# Patient Record
Sex: Female | Born: 1935 | Race: White | Hispanic: No
Health system: Southern US, Community
[De-identification: ages and names within clinical notes are randomized; demographics above are authoritative.]

---

## 2004-06-18 ENCOUNTER — Ambulatory Visit: Payer: Self-pay | Admitting: Internal Medicine

## 2004-07-19 ENCOUNTER — Ambulatory Visit: Payer: Self-pay | Admitting: Internal Medicine

## 2004-08-25 ENCOUNTER — Ambulatory Visit: Payer: Self-pay | Admitting: Internal Medicine

## 2004-09-18 ENCOUNTER — Ambulatory Visit: Payer: Self-pay | Admitting: Internal Medicine

## 2004-10-19 ENCOUNTER — Ambulatory Visit: Payer: Self-pay | Admitting: Internal Medicine

## 2004-12-01 ENCOUNTER — Ambulatory Visit: Payer: Self-pay | Admitting: Internal Medicine

## 2004-12-17 ENCOUNTER — Ambulatory Visit: Payer: Self-pay | Admitting: Internal Medicine

## 2005-01-16 ENCOUNTER — Ambulatory Visit: Payer: Self-pay | Admitting: Internal Medicine

## 2005-02-23 ENCOUNTER — Ambulatory Visit: Payer: Self-pay | Admitting: Internal Medicine

## 2005-03-18 ENCOUNTER — Ambulatory Visit: Payer: Self-pay | Admitting: Internal Medicine

## 2005-04-18 ENCOUNTER — Ambulatory Visit: Payer: Self-pay | Admitting: Internal Medicine

## 2005-06-05 ENCOUNTER — Ambulatory Visit: Payer: Self-pay | Admitting: Internal Medicine

## 2005-06-07 ENCOUNTER — Ambulatory Visit: Payer: Self-pay | Admitting: Internal Medicine

## 2005-06-18 ENCOUNTER — Ambulatory Visit: Payer: Self-pay | Admitting: Internal Medicine

## 2005-07-19 ENCOUNTER — Ambulatory Visit: Payer: Self-pay | Admitting: Internal Medicine

## 2005-08-18 ENCOUNTER — Ambulatory Visit: Payer: Self-pay | Admitting: Internal Medicine

## 2005-09-18 ENCOUNTER — Ambulatory Visit: Payer: Self-pay | Admitting: Internal Medicine

## 2005-10-19 ENCOUNTER — Ambulatory Visit: Payer: Self-pay | Admitting: Internal Medicine

## 2005-11-16 ENCOUNTER — Ambulatory Visit: Payer: Self-pay | Admitting: Internal Medicine

## 2005-12-17 ENCOUNTER — Ambulatory Visit: Payer: Self-pay | Admitting: Internal Medicine

## 2006-01-16 ENCOUNTER — Ambulatory Visit: Payer: Self-pay | Admitting: Internal Medicine

## 2006-02-16 ENCOUNTER — Ambulatory Visit: Payer: Self-pay | Admitting: Internal Medicine

## 2006-03-18 ENCOUNTER — Ambulatory Visit: Payer: Self-pay | Admitting: Internal Medicine

## 2006-04-18 ENCOUNTER — Ambulatory Visit: Payer: Self-pay | Admitting: Internal Medicine

## 2006-05-28 ENCOUNTER — Ambulatory Visit: Payer: Self-pay | Admitting: Internal Medicine

## 2006-06-15 ENCOUNTER — Ambulatory Visit: Payer: Self-pay | Admitting: Internal Medicine

## 2006-06-18 ENCOUNTER — Ambulatory Visit: Payer: Self-pay | Admitting: Internal Medicine

## 2006-07-19 ENCOUNTER — Ambulatory Visit: Payer: Self-pay | Admitting: Internal Medicine

## 2006-08-18 ENCOUNTER — Ambulatory Visit: Payer: Self-pay | Admitting: Internal Medicine

## 2006-09-06 ENCOUNTER — Ambulatory Visit: Payer: Self-pay | Admitting: Surgery

## 2006-09-18 ENCOUNTER — Ambulatory Visit: Payer: Self-pay | Admitting: Internal Medicine

## 2006-10-19 ENCOUNTER — Ambulatory Visit: Payer: Self-pay | Admitting: Internal Medicine

## 2006-11-17 ENCOUNTER — Ambulatory Visit: Payer: Self-pay | Admitting: Internal Medicine

## 2006-12-18 ENCOUNTER — Ambulatory Visit: Payer: Self-pay | Admitting: Internal Medicine

## 2007-01-17 ENCOUNTER — Ambulatory Visit: Payer: Self-pay | Admitting: Internal Medicine

## 2007-02-17 ENCOUNTER — Ambulatory Visit: Payer: Self-pay | Admitting: Internal Medicine

## 2007-03-11 ENCOUNTER — Ambulatory Visit: Payer: Self-pay | Admitting: Internal Medicine

## 2007-03-15 ENCOUNTER — Other Ambulatory Visit: Payer: Self-pay

## 2007-03-15 ENCOUNTER — Inpatient Hospital Stay: Payer: Self-pay | Admitting: Oncology

## 2007-03-19 ENCOUNTER — Ambulatory Visit: Payer: Self-pay | Admitting: Internal Medicine

## 2007-04-19 ENCOUNTER — Ambulatory Visit: Payer: Self-pay | Admitting: Internal Medicine

## 2007-05-20 ENCOUNTER — Ambulatory Visit: Payer: Self-pay | Admitting: Internal Medicine

## 2007-06-19 ENCOUNTER — Ambulatory Visit: Payer: Self-pay | Admitting: Internal Medicine

## 2007-07-15 ENCOUNTER — Ambulatory Visit: Payer: Self-pay | Admitting: Internal Medicine

## 2007-07-20 ENCOUNTER — Ambulatory Visit: Payer: Self-pay | Admitting: Internal Medicine

## 2007-08-19 ENCOUNTER — Ambulatory Visit: Payer: Self-pay | Admitting: Internal Medicine

## 2007-08-20 ENCOUNTER — Encounter: Payer: Self-pay | Admitting: Internal Medicine

## 2007-08-20 ENCOUNTER — Ambulatory Visit: Payer: Self-pay | Admitting: Internal Medicine

## 2007-08-26 ENCOUNTER — Inpatient Hospital Stay: Payer: Self-pay | Admitting: Internal Medicine

## 2007-08-26 ENCOUNTER — Other Ambulatory Visit: Payer: Self-pay

## 2007-09-19 ENCOUNTER — Ambulatory Visit: Payer: Self-pay | Admitting: Internal Medicine

## 2007-09-19 ENCOUNTER — Encounter: Payer: Self-pay | Admitting: Internal Medicine

## 2007-10-20 ENCOUNTER — Ambulatory Visit: Payer: Self-pay | Admitting: Internal Medicine

## 2007-11-07 ENCOUNTER — Encounter: Payer: Self-pay | Admitting: Internal Medicine

## 2007-11-17 ENCOUNTER — Ambulatory Visit: Payer: Self-pay | Admitting: Internal Medicine

## 2007-12-18 ENCOUNTER — Ambulatory Visit: Payer: Self-pay | Admitting: Internal Medicine

## 2008-01-17 ENCOUNTER — Ambulatory Visit: Payer: Self-pay | Admitting: Internal Medicine

## 2008-02-17 ENCOUNTER — Ambulatory Visit: Payer: Self-pay | Admitting: Internal Medicine

## 2008-03-18 ENCOUNTER — Ambulatory Visit: Payer: Self-pay | Admitting: Internal Medicine

## 2008-04-18 ENCOUNTER — Ambulatory Visit: Payer: Self-pay | Admitting: Internal Medicine

## 2008-05-04 ENCOUNTER — Ambulatory Visit: Payer: Self-pay | Admitting: Internal Medicine

## 2008-05-19 ENCOUNTER — Ambulatory Visit: Payer: Self-pay | Admitting: Internal Medicine

## 2008-06-11 ENCOUNTER — Ambulatory Visit: Payer: Self-pay | Admitting: Internal Medicine

## 2008-06-18 ENCOUNTER — Ambulatory Visit: Payer: Self-pay | Admitting: Internal Medicine

## 2008-07-19 ENCOUNTER — Ambulatory Visit: Payer: Self-pay | Admitting: Internal Medicine

## 2008-08-18 ENCOUNTER — Ambulatory Visit: Payer: Self-pay | Admitting: Internal Medicine

## 2008-09-18 ENCOUNTER — Ambulatory Visit: Payer: Self-pay | Admitting: Internal Medicine

## 2008-10-19 ENCOUNTER — Ambulatory Visit: Payer: Self-pay | Admitting: Internal Medicine

## 2008-11-16 ENCOUNTER — Ambulatory Visit: Payer: Self-pay | Admitting: Internal Medicine

## 2009-01-01 ENCOUNTER — Ambulatory Visit: Payer: Self-pay | Admitting: Internal Medicine

## 2009-01-04 ENCOUNTER — Ambulatory Visit: Payer: Self-pay | Admitting: Internal Medicine

## 2009-01-16 ENCOUNTER — Ambulatory Visit: Payer: Self-pay | Admitting: Internal Medicine

## 2009-03-01 ENCOUNTER — Ambulatory Visit: Payer: Self-pay | Admitting: Internal Medicine

## 2009-03-12 ENCOUNTER — Ambulatory Visit: Payer: Self-pay | Admitting: General Practice

## 2009-03-18 ENCOUNTER — Ambulatory Visit: Payer: Self-pay | Admitting: Internal Medicine

## 2009-03-29 ENCOUNTER — Ambulatory Visit: Payer: Self-pay | Admitting: Internal Medicine

## 2009-04-08 ENCOUNTER — Ambulatory Visit: Payer: Self-pay | Admitting: General Practice

## 2009-04-18 ENCOUNTER — Ambulatory Visit: Payer: Self-pay | Admitting: Internal Medicine

## 2009-04-19 ENCOUNTER — Inpatient Hospital Stay: Payer: Self-pay | Admitting: General Practice

## 2009-04-24 ENCOUNTER — Encounter: Payer: Self-pay | Admitting: Internal Medicine

## 2009-04-24 ENCOUNTER — Ambulatory Visit: Payer: Self-pay | Admitting: Internal Medicine

## 2009-06-01 ENCOUNTER — Ambulatory Visit: Payer: Self-pay | Admitting: Internal Medicine

## 2009-06-11 ENCOUNTER — Encounter: Payer: Self-pay | Admitting: General Practice

## 2009-06-18 ENCOUNTER — Ambulatory Visit: Payer: Self-pay | Admitting: Internal Medicine

## 2009-06-18 ENCOUNTER — Encounter: Payer: Self-pay | Admitting: General Practice

## 2009-06-28 ENCOUNTER — Ambulatory Visit: Payer: Self-pay | Admitting: Internal Medicine

## 2009-07-19 ENCOUNTER — Ambulatory Visit: Payer: Self-pay | Admitting: Internal Medicine

## 2009-07-19 ENCOUNTER — Encounter: Payer: Self-pay | Admitting: General Practice

## 2009-08-18 ENCOUNTER — Ambulatory Visit: Payer: Self-pay | Admitting: Internal Medicine

## 2009-09-18 ENCOUNTER — Ambulatory Visit: Payer: Self-pay | Admitting: Internal Medicine

## 2009-10-19 ENCOUNTER — Ambulatory Visit: Payer: Self-pay | Admitting: Internal Medicine

## 2009-11-16 ENCOUNTER — Ambulatory Visit: Payer: Self-pay | Admitting: Internal Medicine

## 2009-12-17 ENCOUNTER — Ambulatory Visit: Payer: Self-pay | Admitting: Internal Medicine

## 2010-01-16 ENCOUNTER — Ambulatory Visit: Payer: Self-pay | Admitting: Internal Medicine

## 2010-02-16 ENCOUNTER — Ambulatory Visit: Payer: Self-pay | Admitting: Internal Medicine

## 2010-02-17 ENCOUNTER — Ambulatory Visit: Payer: Self-pay | Admitting: Internal Medicine

## 2010-03-18 ENCOUNTER — Ambulatory Visit: Payer: Self-pay | Admitting: Internal Medicine

## 2010-04-18 ENCOUNTER — Ambulatory Visit: Payer: Self-pay | Admitting: Internal Medicine

## 2010-05-10 ENCOUNTER — Ambulatory Visit: Payer: Self-pay | Admitting: Internal Medicine

## 2010-05-19 ENCOUNTER — Ambulatory Visit: Payer: Self-pay | Admitting: Internal Medicine

## 2010-06-18 ENCOUNTER — Ambulatory Visit: Payer: Self-pay | Admitting: Internal Medicine

## 2010-07-19 ENCOUNTER — Ambulatory Visit: Payer: Self-pay | Admitting: Internal Medicine

## 2010-08-18 ENCOUNTER — Ambulatory Visit: Payer: Self-pay | Admitting: Internal Medicine

## 2010-09-18 ENCOUNTER — Ambulatory Visit: Payer: Self-pay | Admitting: Internal Medicine

## 2010-10-19 ENCOUNTER — Ambulatory Visit: Payer: Self-pay | Admitting: Internal Medicine

## 2010-11-17 ENCOUNTER — Ambulatory Visit: Payer: Self-pay | Admitting: Internal Medicine

## 2010-12-18 ENCOUNTER — Ambulatory Visit: Payer: Self-pay | Admitting: Internal Medicine

## 2010-12-22 ENCOUNTER — Ambulatory Visit: Payer: Self-pay

## 2010-12-28 ENCOUNTER — Ambulatory Visit: Payer: Self-pay | Admitting: Internal Medicine

## 2010-12-29 ENCOUNTER — Ambulatory Visit: Payer: Self-pay

## 2011-01-17 ENCOUNTER — Ambulatory Visit: Payer: Self-pay | Admitting: Internal Medicine

## 2011-02-14 ENCOUNTER — Ambulatory Visit: Payer: Self-pay | Admitting: Unknown Physician Specialty

## 2011-02-17 ENCOUNTER — Ambulatory Visit: Payer: Self-pay | Admitting: Internal Medicine

## 2011-02-22 ENCOUNTER — Ambulatory Visit: Payer: Self-pay | Admitting: Unknown Physician Specialty

## 2011-02-22 ENCOUNTER — Emergency Department: Payer: Self-pay | Admitting: Emergency Medicine

## 2011-02-24 LAB — PATHOLOGY REPORT

## 2011-03-23 ENCOUNTER — Ambulatory Visit: Payer: Self-pay | Admitting: Internal Medicine

## 2011-04-19 ENCOUNTER — Ambulatory Visit: Payer: Self-pay | Admitting: Internal Medicine

## 2011-04-20 ENCOUNTER — Ambulatory Visit: Payer: Self-pay | Admitting: Internal Medicine

## 2011-05-15 ENCOUNTER — Inpatient Hospital Stay: Payer: Self-pay | Admitting: Internal Medicine

## 2011-05-20 ENCOUNTER — Ambulatory Visit: Payer: Self-pay | Admitting: Internal Medicine

## 2011-06-19 ENCOUNTER — Ambulatory Visit: Payer: Self-pay | Admitting: Internal Medicine

## 2011-07-20 ENCOUNTER — Ambulatory Visit: Payer: Self-pay | Admitting: Internal Medicine

## 2011-08-30 ENCOUNTER — Ambulatory Visit: Payer: Self-pay | Admitting: Internal Medicine

## 2011-09-19 ENCOUNTER — Ambulatory Visit: Payer: Self-pay | Admitting: Internal Medicine

## 2011-09-27 LAB — CANCER CENTER HEMOGLOBIN: HGB: 12.7 g/dL (ref 12.0–16.0)

## 2011-10-20 ENCOUNTER — Ambulatory Visit: Payer: Self-pay | Admitting: Internal Medicine

## 2011-10-25 LAB — FERRITIN: Ferritin (ARMC): 26 ng/mL (ref 8–388)

## 2011-10-25 LAB — CANCER CENTER HEMOGLOBIN: HGB: 12.3 g/dL (ref 12.0–16.0)

## 2011-11-15 LAB — CANCER CENTER HEMOGLOBIN: HGB: 12.1 g/dL (ref 12.0–16.0)

## 2011-11-17 ENCOUNTER — Ambulatory Visit: Payer: Self-pay | Admitting: Internal Medicine

## 2011-12-06 LAB — CANCER CENTER HEMOGLOBIN: HGB: 11.5 g/dL — ABNORMAL LOW (ref 12.0–16.0)

## 2011-12-18 ENCOUNTER — Ambulatory Visit: Payer: Self-pay | Admitting: Internal Medicine

## 2012-01-10 ENCOUNTER — Ambulatory Visit: Payer: Self-pay | Admitting: Internal Medicine

## 2012-01-10 LAB — CANCER CENTER HEMOGLOBIN: HGB: 11.2 g/dL — ABNORMAL LOW (ref 12.0–16.0)

## 2012-01-16 ENCOUNTER — Emergency Department: Payer: Self-pay | Admitting: Emergency Medicine

## 2012-01-16 LAB — CBC
HGB: 10.3 g/dL — ABNORMAL LOW (ref 12.0–16.0)
MCH: 29.6 pg (ref 26.0–34.0)
MCHC: 32.2 g/dL (ref 32.0–36.0)
Platelet: 226 10*3/uL (ref 150–440)
RBC: 3.47 10*6/uL — ABNORMAL LOW (ref 3.80–5.20)

## 2012-01-16 LAB — COMPREHENSIVE METABOLIC PANEL
Alkaline Phosphatase: 105 U/L (ref 50–136)
Anion Gap: 6 — ABNORMAL LOW (ref 7–16)
BUN: 12 mg/dL (ref 7–18)
Calcium, Total: 9.1 mg/dL (ref 8.5–10.1)
Chloride: 96 mmol/L — ABNORMAL LOW (ref 98–107)
Co2: 28 mmol/L (ref 21–32)
Creatinine: 0.7 mg/dL (ref 0.60–1.30)
EGFR (African American): >60
EGFR (Non-African Amer.): >60
Osmolality: 275 (ref 275–301)
Potassium: 3.9 mmol/L (ref 3.5–5.1)
SGPT (ALT): 23 U/L
Total Protein: 7.9 g/dL (ref 6.4–8.2)

## 2012-01-16 LAB — CK TOTAL AND CKMB (NOT AT ARMC): CK-MB: 1.8 ng/mL (ref 0.5–3.6)

## 2012-01-17 ENCOUNTER — Ambulatory Visit: Payer: Self-pay | Admitting: Internal Medicine

## 2012-01-22 LAB — CULTURE, BLOOD (SINGLE)

## 2012-02-07 LAB — CANCER CENTER HEMOGLOBIN: HGB: 11.2 g/dL — ABNORMAL LOW (ref 12.0–16.0)

## 2012-02-17 ENCOUNTER — Ambulatory Visit: Payer: Self-pay | Admitting: Internal Medicine

## 2012-03-07 LAB — CANCER CENTER HEMOGLOBIN: HGB: 12.8 g/dL (ref 12.0–16.0)

## 2012-03-18 ENCOUNTER — Ambulatory Visit: Payer: Self-pay | Admitting: Internal Medicine

## 2012-04-18 ENCOUNTER — Ambulatory Visit: Payer: Self-pay | Admitting: Internal Medicine

## 2012-04-18 LAB — FERRITIN: Ferritin (ARMC): 15 ng/mL (ref 8–388)

## 2012-04-18 LAB — CANCER CENTER HEMOGLOBIN: HGB: 9.7 g/dL — ABNORMAL LOW (ref 12.0–16.0)

## 2012-05-19 ENCOUNTER — Ambulatory Visit: Payer: Self-pay | Admitting: Internal Medicine

## 2012-05-24 LAB — FERRITIN: Ferritin (ARMC): 124 ng/mL (ref 8–388)

## 2012-05-24 LAB — CANCER CENTER HEMOGLOBIN: HGB: 11.7 g/dL — ABNORMAL LOW (ref 12.0–16.0)

## 2012-06-18 ENCOUNTER — Ambulatory Visit: Payer: Self-pay | Admitting: Internal Medicine

## 2012-06-21 ENCOUNTER — Observation Stay: Payer: Self-pay | Admitting: Internal Medicine

## 2012-06-21 LAB — CBC CANCER CENTER
Basophil #: 0.1 x10 3/mm (ref 0.0–0.1)
Eosinophil #: 0.4 x10 3/mm (ref 0.0–0.7)
HCT: 31.6 % — ABNORMAL LOW (ref 35.0–47.0)
HGB: 9.8 g/dL — ABNORMAL LOW (ref 12.0–16.0)
Lymphocyte %: 16.8 %
MCH: 29.2 pg (ref 26.0–34.0)
MCHC: 31.1 g/dL — ABNORMAL LOW (ref 32.0–36.0)
MCV: 94 fL (ref 80–100)
Monocyte #: 1.4 x10 3/mm — ABNORMAL HIGH (ref 0.2–0.9)
Neutrophil #: 8.5 x10 3/mm — ABNORMAL HIGH (ref 1.4–6.5)
Neutrophil %: 68.1 %
Platelet: 260 x10 3/mm (ref 150–440)
RBC: 3.36 10*6/uL — ABNORMAL LOW (ref 3.80–5.20)
RDW: 18.2 % — ABNORMAL HIGH (ref 11.5–14.5)
WBC: 12.4 x10 3/mm — ABNORMAL HIGH (ref 3.6–11.0)

## 2012-06-21 LAB — BASIC METABOLIC PANEL
Anion Gap: 10 (ref 7–16)
BUN: 13 mg/dL (ref 7–18)
Creatinine: 0.83 mg/dL (ref 0.60–1.30)
EGFR (Non-African Amer.): >60
Glucose: 293 mg/dL — ABNORMAL HIGH (ref 65–99)
Osmolality: 283 (ref 275–301)

## 2012-06-21 LAB — FERRITIN: Ferritin (ARMC): 23 ng/mL (ref 8–388)

## 2012-06-22 DIAGNOSIS — I499 Cardiac arrhythmia, unspecified: Secondary | ICD-10-CM

## 2012-06-22 LAB — HEMOGLOBIN: HGB: 11.8 g/dL — ABNORMAL LOW (ref 12.0–16.0)

## 2012-06-22 LAB — CK TOTAL AND CKMB (NOT AT ARMC): CK-MB: 1.5 ng/mL (ref 0.5–3.6)

## 2012-06-28 ENCOUNTER — Ambulatory Visit: Payer: Self-pay | Admitting: Internal Medicine

## 2012-07-12 LAB — FERRITIN: Ferritin (ARMC): 199 ng/mL (ref 8–388)

## 2012-07-19 ENCOUNTER — Ambulatory Visit: Payer: Self-pay | Admitting: Internal Medicine

## 2012-07-26 LAB — FERRITIN: Ferritin (ARMC): 81 ng/mL (ref 8–388)

## 2012-08-18 ENCOUNTER — Ambulatory Visit: Payer: Self-pay | Admitting: Internal Medicine

## 2012-08-21 LAB — FERRITIN: Ferritin (ARMC): 33 ng/mL (ref 8–388)

## 2012-08-23 ENCOUNTER — Emergency Department: Payer: Self-pay | Admitting: Emergency Medicine

## 2012-08-23 LAB — CBC WITH DIFFERENTIAL/PLATELET
Basophil %: 0.4 %
Eosinophil %: 1.6 %
HGB: 10.9 g/dL — ABNORMAL LOW (ref 12.0–16.0)
Lymphocyte #: 1.7 10*3/uL (ref 1.0–3.6)
MCHC: 32.6 g/dL (ref 32.0–36.0)
Monocyte %: 5.5 %
Neutrophil #: 17.7 10*3/uL — ABNORMAL HIGH (ref 1.4–6.5)
RDW: 15.7 % — ABNORMAL HIGH (ref 11.5–14.5)

## 2012-08-23 LAB — PROTIME-INR
INR: 0.9
Prothrombin Time: 12.8 secs (ref 11.5–14.7)

## 2012-08-23 LAB — URINALYSIS, COMPLETE
Bacteria: NONE SEEN
Bilirubin,UR: NEGATIVE
Blood: NEGATIVE
Glucose,UR: NEGATIVE mg/dL (ref 0–75)
Hyaline Cast: 1
Nitrite: NEGATIVE
Protein: NEGATIVE
Specific Gravity: 1.009 (ref 1.003–1.030)
WBC UR: 1 /HPF (ref 0–5)

## 2012-08-23 LAB — COMPREHENSIVE METABOLIC PANEL
Bilirubin,Total: 0.3 mg/dL (ref 0.2–1.0)
Calcium, Total: 9.8 mg/dL (ref 8.5–10.1)
Chloride: 102 mmol/L (ref 98–107)
Co2: 25 mmol/L (ref 21–32)
Creatinine: 0.74 mg/dL (ref 0.60–1.30)
EGFR (African American): >60
EGFR (Non-African Amer.): >60
Osmolality: 276 (ref 275–301)
Potassium: 4.2 mmol/L (ref 3.5–5.1)
SGPT (ALT): 24 U/L (ref 12–78)
Sodium: 134 mmol/L — ABNORMAL LOW (ref 136–145)
Total Protein: 8.4 g/dL — ABNORMAL HIGH (ref 6.4–8.2)

## 2012-08-23 LAB — CBC
HGB: 9.8 g/dL — ABNORMAL LOW (ref 12.0–16.0)
MCHC: 33 g/dL (ref 32.0–36.0)
RBC: 3.22 10*6/uL — ABNORMAL LOW (ref 3.80–5.20)
WBC: 21.5 10*3/uL — ABNORMAL HIGH (ref 3.6–11.0)

## 2012-09-18 ENCOUNTER — Ambulatory Visit: Payer: Self-pay | Admitting: Internal Medicine

## 2012-10-03 ENCOUNTER — Emergency Department: Payer: Self-pay | Admitting: Internal Medicine

## 2012-10-03 LAB — BASIC METABOLIC PANEL
BUN: 12 mg/dL (ref 7–18)
Calcium, Total: 9.4 mg/dL (ref 8.5–10.1)
Chloride: 105 mmol/L (ref 98–107)
Creatinine: 0.59 mg/dL — ABNORMAL LOW (ref 0.60–1.30)
EGFR (African American): >60
EGFR (Non-African Amer.): >60
Osmolality: 279 (ref 275–301)
Potassium: 3.6 mmol/L (ref 3.5–5.1)
Sodium: 138 mmol/L (ref 136–145)

## 2012-10-03 LAB — CBC
HCT: 37.9 % (ref 35.0–47.0)
MCH: 29.3 pg (ref 26.0–34.0)
MCV: 92 fL (ref 80–100)
Platelet: 180 10*3/uL (ref 150–440)
RDW: 15 % — ABNORMAL HIGH (ref 11.5–14.5)
WBC: 16.5 10*3/uL — ABNORMAL HIGH (ref 3.6–11.0)

## 2012-10-03 LAB — URINALYSIS, COMPLETE
Blood: NEGATIVE
Ketone: NEGATIVE
Ph: 5 (ref 4.5–8.0)
RBC,UR: <1 /HPF (ref 0–5)
Squamous Epithelial: 1
WBC UR: 4 /HPF (ref 0–5)

## 2014-08-18 DEATH — deceased

## 2014-09-21 IMAGING — CR DG CHEST 1V
1 series · 1 of 1 positions shown · non-contrast
Comparison: none

REASON FOR EXAM: fall poss syncope
COMMENTS:

PROCEDURE:     DXR - DXR CHEST 1 VIEWAP OR PA  - August 23, 2012  [DATE]
RESULT:     Comparison: 06/22/2012

[ap]
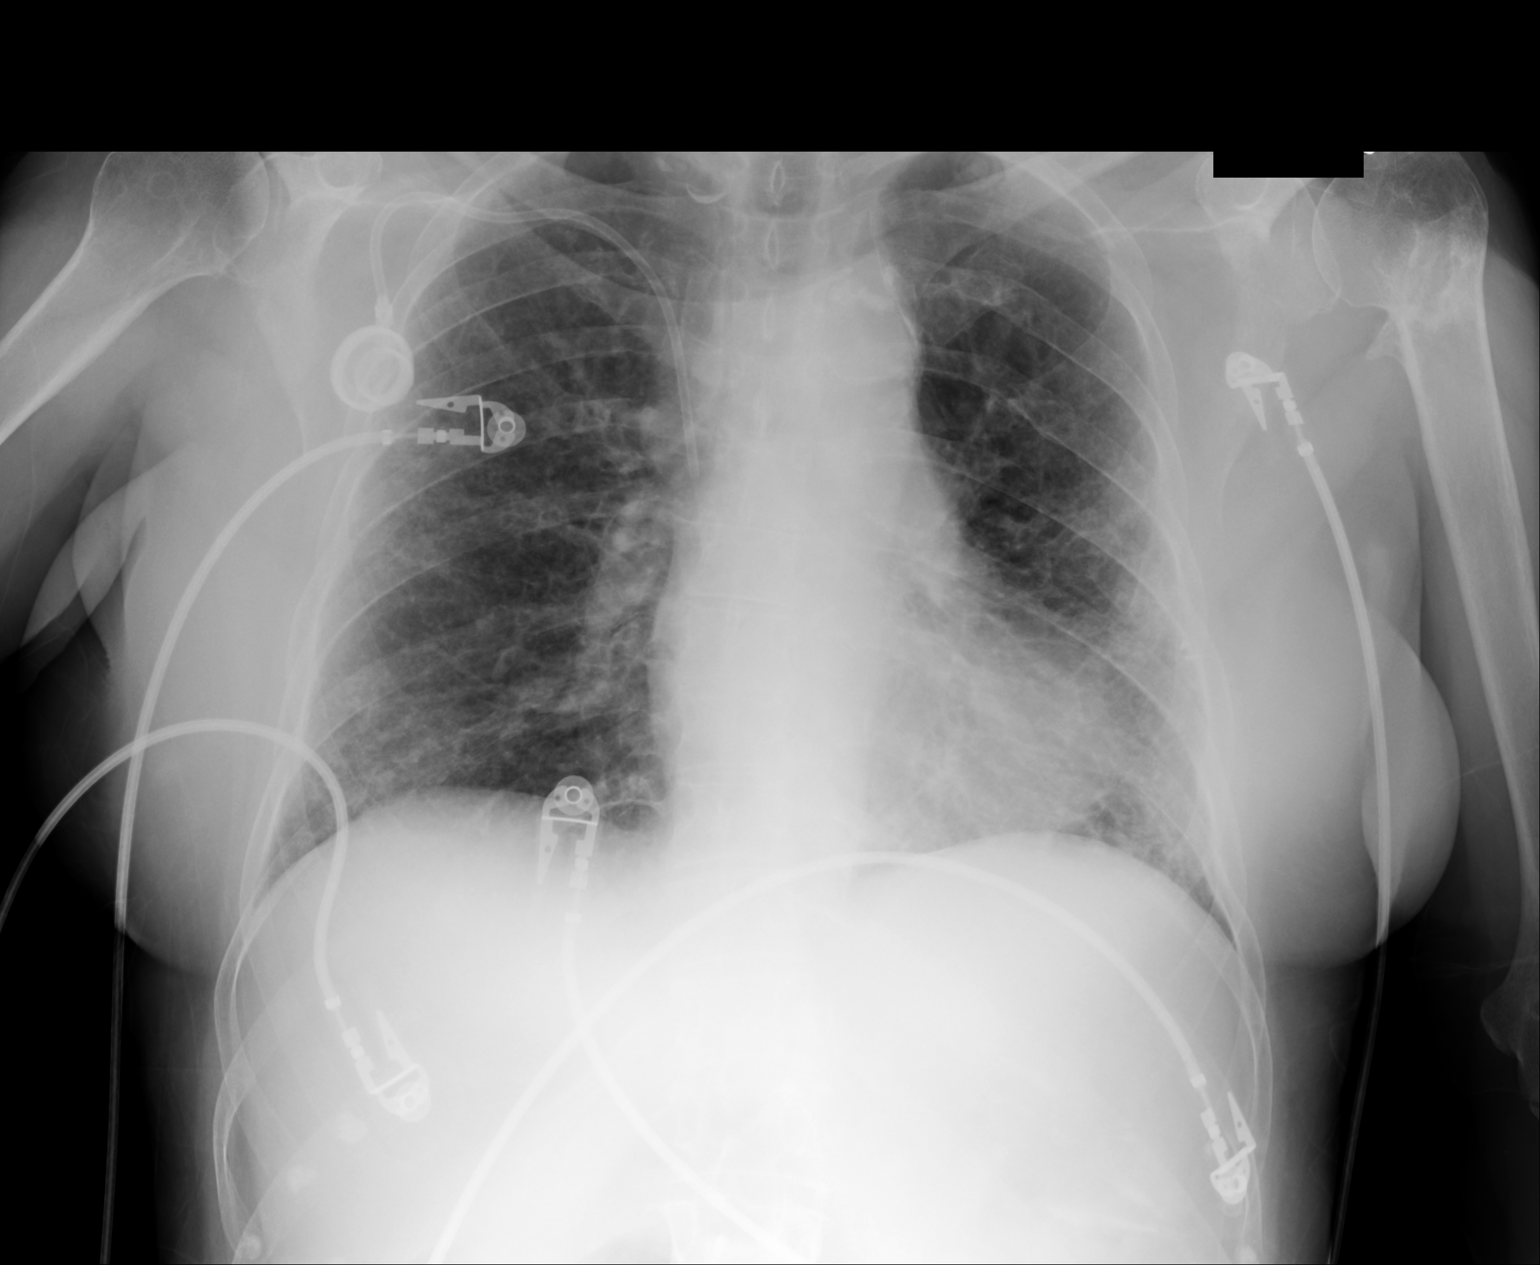

[1 of 1 positions shown; findings below may reference images not displayed]

FINDINGS: Single portable AP chest radiograph is provided. There is a right-sided
Port-A-Cath in satisfactory position. There is bilateral diffuse
interstitial thickening likely representing chronic interstitial disease,
but superimposed mild interstitial edema versus interstitial pneumonitis
secondary to an infectious or inflammatory etiology cannot be excluded.
There is no focal parenchymal opacity, pleural effusion, or pneumothorax.
Normal cardiomediastinal silhouette. The osseous structures are unremarkable.
IMPRESSION: There is bilateral diffuse interstitial thickening likely representing
chronic interstitial disease, but superimposed mild interstitial edema
versus interstitial pneumonitis secondary to an infectious or inflammatory
etiology cannot be excluded.

[REDACTED]

## 2014-09-21 IMAGING — CT CT HEAD WITHOUT CONTRAST
3 of 4 series · 16 of 30 positions shown, 18 images · non-contrast
Comparison: none

REASON FOR EXAM: fall
COMMENTS:

[Series 2: without · axial · non-contrast · 0.41mm/px · z∈[-177,-72]mm · 5 of 33 slices shown]
[im 6/33  brain]
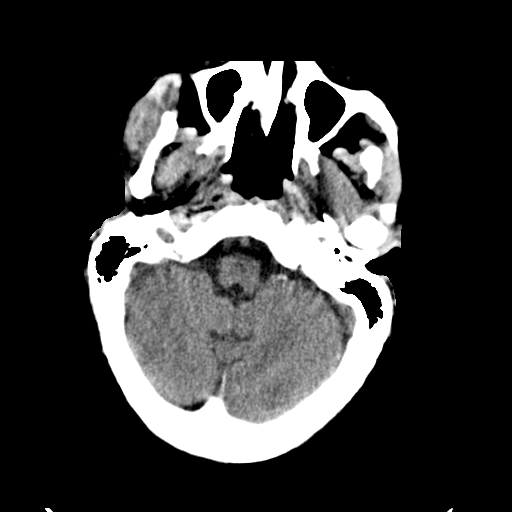
[im 11/33  brain]
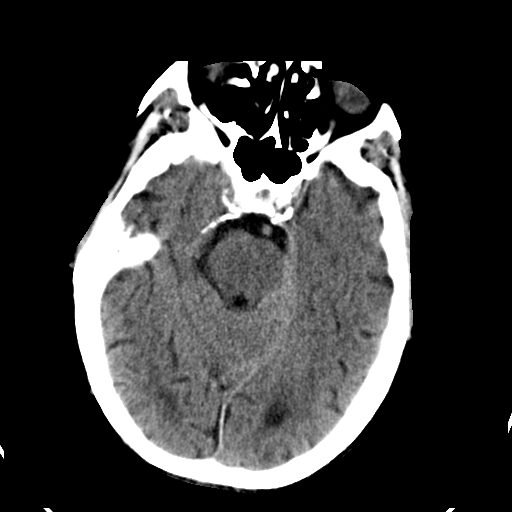
[im 17/33  brain]
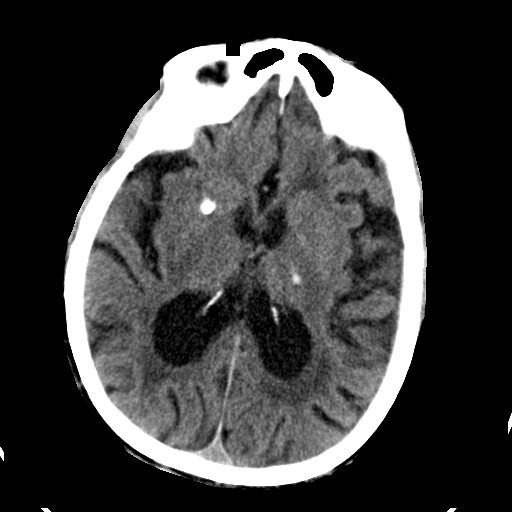
[im 22/33  brain]
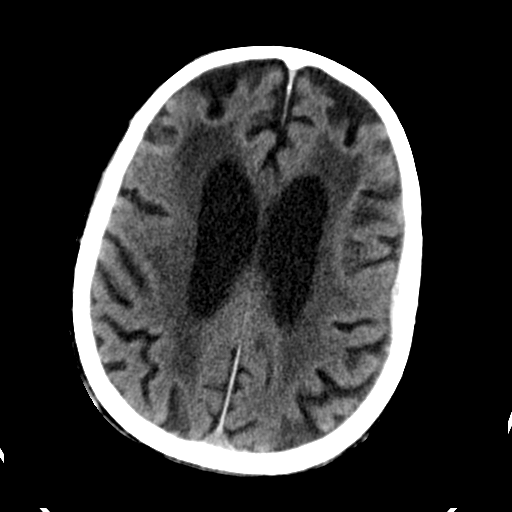
[im 27/33  brain]
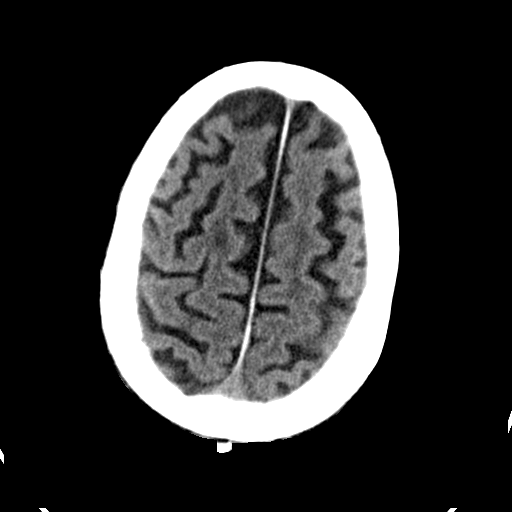

[Series 4: bone recon · axial · 0.41mm/px · z∈[-172,-72]mm · 5 of 32 slices shown]
[im 6/32  bone]
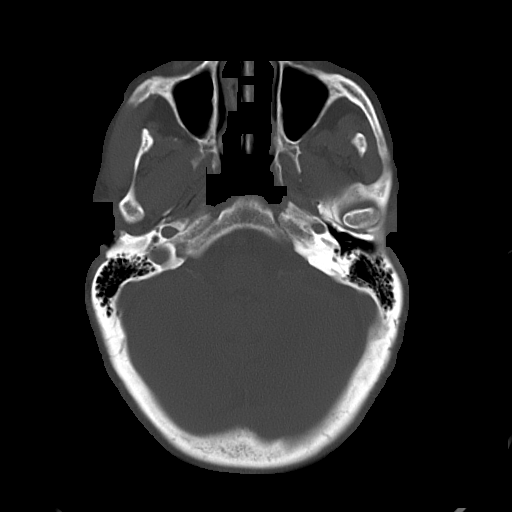
[im 11/32  bone]
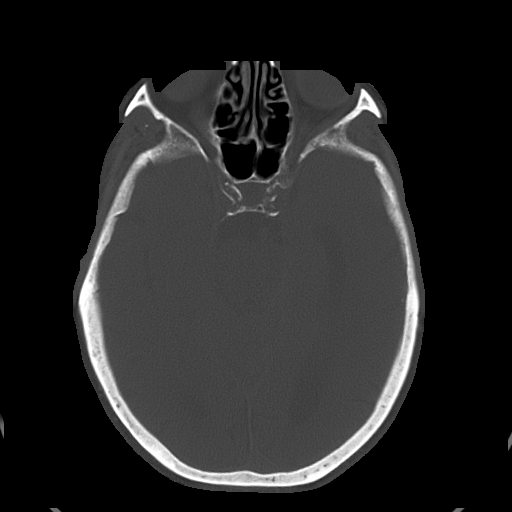
[im 16/32  bone]
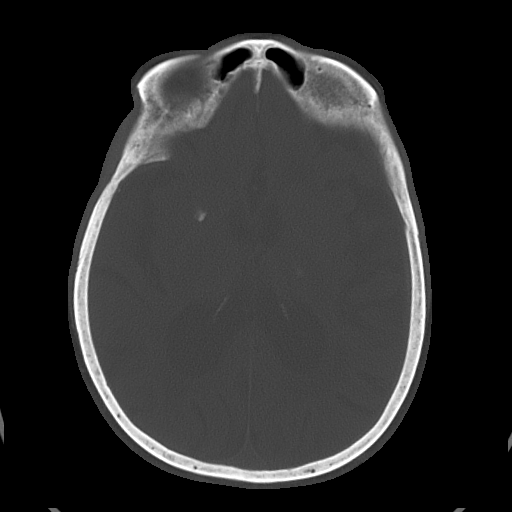
[im 21/32  bone]
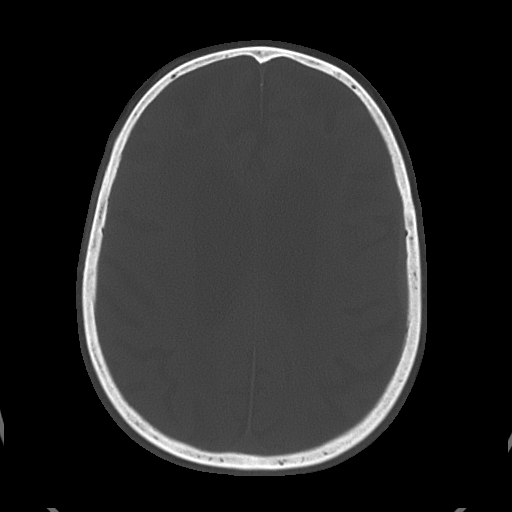
[im 26/32  bone]
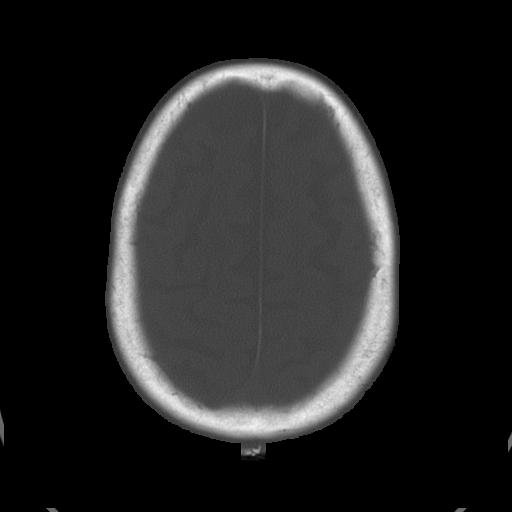

[Series 5: without recon · axial · non-contrast · 0.41mm/px · z∈[-178,-58]mm · 6 of 34 slices shown, 8 images]
[im 5/34  brain]
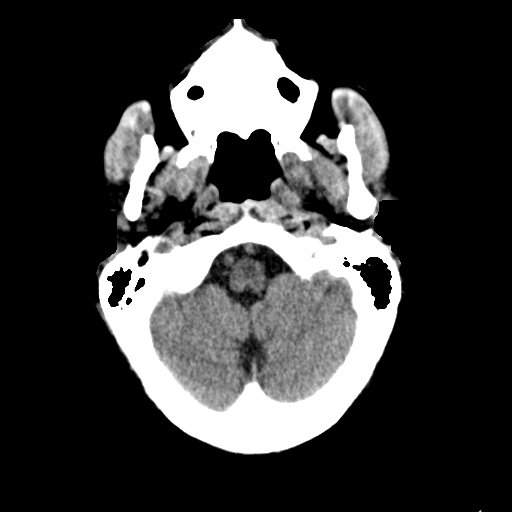
[im 5/34  bone]
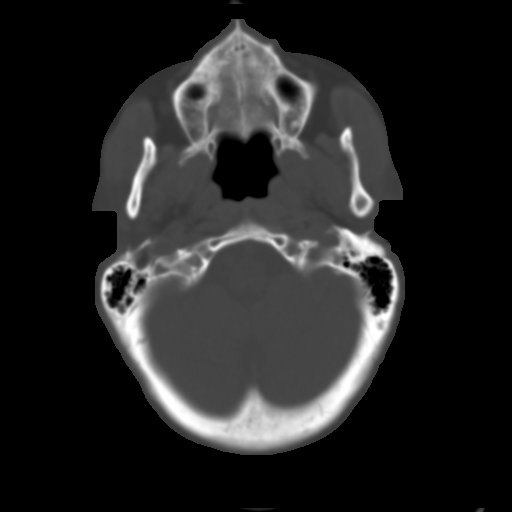
[im 10/34  brain]
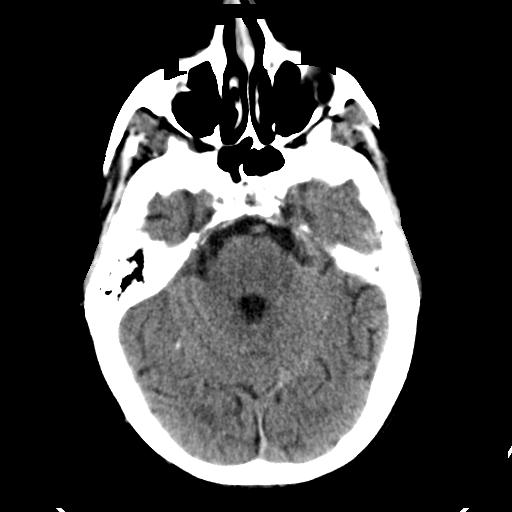
[im 15/34  brain]
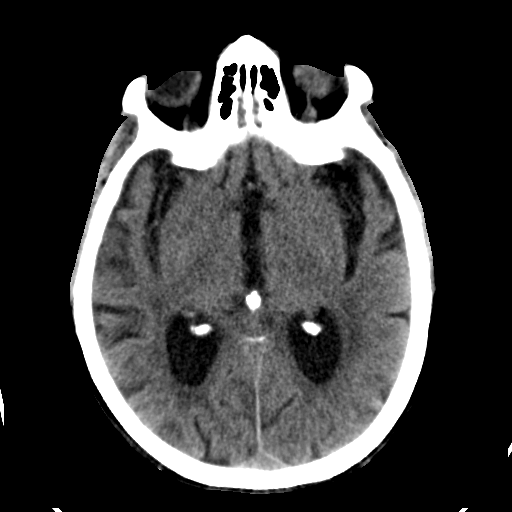
[im 19/34  brain]
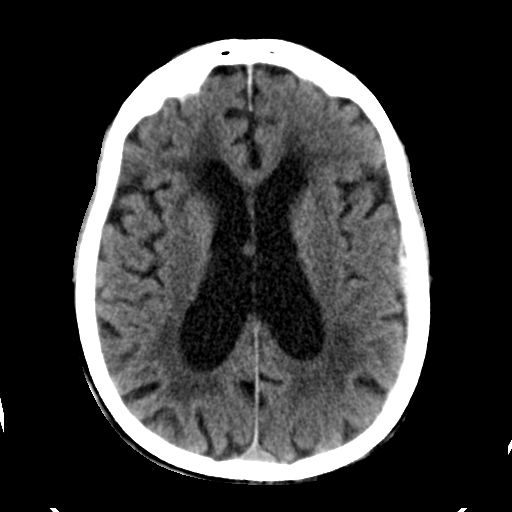
[im 24/34  brain]
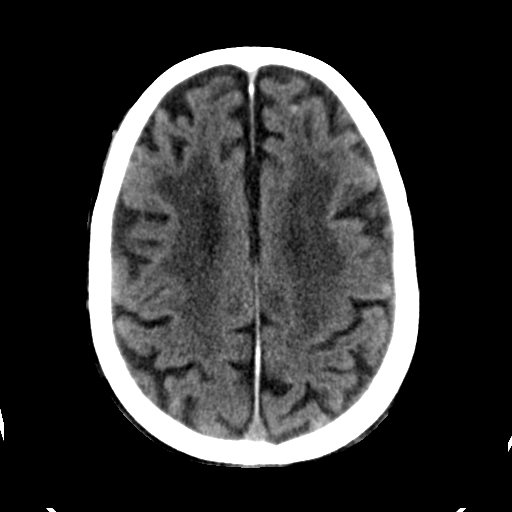
[im 24/34  bone]
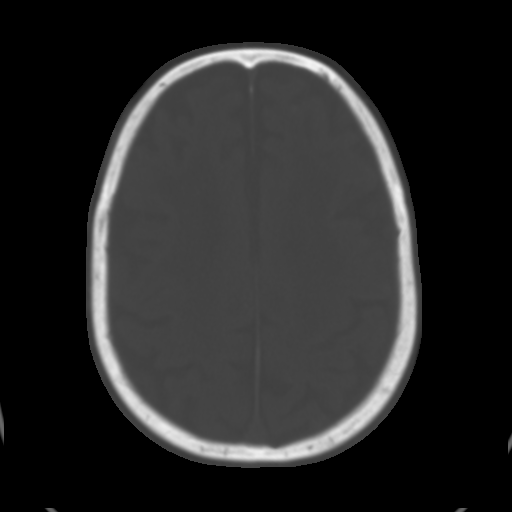
[im 29/34  brain]
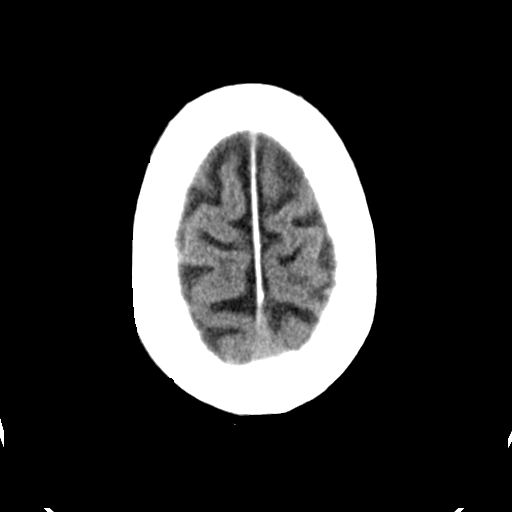

[16 of 30 positions shown; findings below may reference images not displayed]

PROCEDURE:     CT  - CT HEAD WITHOUT CONTRAST  - August 23, 2012  [DATE]

RESULT:     Axial noncontrast CT scanning was performed through the brain
with reconstructions at 5 mm intervals and slice thicknesses.

There is abnormally increased density adjacent to the ambient wing cistern
and adjacent to the tentorium cerebelli on the left consistent with acute
hemorrhage likely subarachnoid. There is no mass effect currently. There is
a tiny amount of acute blood just to the right of midline within the roof of
the third ventricle. There is a tiny focus of hemorrhage in the left frontal
lobe in a sulcus on image 24 to 26.

There are stable calcifications in the basal ganglia. There is decreased
density in the deep white matter of both cerebral hemispheres consistent
with chronic small vessel ischemia. There is moderate diffuse cerebral and
cerebellar atrophy with compensatory ventriculomegaly.

At bone window settings the observed portions of the paranasal sinuses and
mastoid air cells are clear. There is no evidence of an acute skull
fracture. There is disruption of the soft tissues over the high right
posterior parietal region.
IMPRESSION: 1. There is increased density along the quadrigeminal plate cistern on the
left and the adjacent tentorium cerebellum which is new. This is consistent
with a subarachnoid hemorrhage. A small amount of blood is noted in a sulcus
in the left frontal region which may reflect a contrecoup injury. This is
demonstrated best on image 24 through 26. Small amount of acute blood in the
roof of the third ventricle is suspected as well.
2. There is moderate diffuse cerebral and cerebellar atrophy with white
matter density changes consistent with patient's age and chronic small
vessel ischemia.
3. No skull fracture is demonstrated.

This report was discussed by me by telephone with Dr. Ach Rifai Rawie at [DATE] p.m. on
August 23, 2012.

[REDACTED]

## 2015-01-05 NOTE — H&P (Signed)
PATIENT NAME:  Kayla, [name] MR#:  [id number] DATE OF BIRTH:  Jun 19, 1925  DATE OF ADMISSION:  06/21/2012  HISTORY OF PRESENT ILLNESS: [name] is an 81 year old patient who is well known to me and is followed regularly with iron deficiency. The patient has documented AV malformations and chronic blood loss and she chronically gets intravenous iron in the form of ferriheme which has supported her hemoglobin. The patient presents today for regular follow-up and hemoglobin is down to 9.8 grams. This is down from 11.7 grams one month earlier. This is a patient who chronically has blood loss and has dark stools chronically, sometimes intermittently, has reported slight increased volume and also darker stools recently. Also has some increased fatigue now even compared to her prior visit with a similar hemoglobin, is short of breath on minimal exertion of walking less than 20 feet in the hallway, although she does not have dizziness or palpitations or retrosternal chest pain. No cough, no wheeze, no hemoptysis, and no other sites of bleeding or bruising.   PAST MEDICAL HISTORY:  1. Cataracts. 2. Glaucoma. 3. Degenerative joint disease. 4. Hip replacement.  5. Osteoporosis.  6. Small bowel resection. 7. Total abdominal hysterectomy. 8. Phlebitis. 9. Appendectomy. 10. Deep vein thrombosis. 11. Diabetes.   MEDICATIONS:  1. Metformin 1000 mg twice a day. 2. Simvastatin 40 mg daily.  3. Multivitamin daily.  4. Os-Cal 500 mg twice a day. 5. Oral-B 12 every 12 hours. 6. Timoptic ophthalmic solution one drop to each eye daily.   ALLERGIES: Listed as sulfa which causes hives, aspirin causes GI distress and contributes to bleeding, and epinephrine has caused headaches.   SOCIAL HISTORY: Quit smoking in 1980. No alcohol.   FAMILY HISTORY: Noncontributory.   ADDITIONAL REVIEW OF SYSTEMS: No headache, no fever, no chills, no nausea or vomiting, and no diarrhea. Appetite has been adequate. The  patient is a chronically slow-moving and uses a walker at home. No edema, no rash, and no bruising.   PHYSICAL EXAMINATION:   GENERAL: Blood pressure 99/66 and pulse 79. There is pallor. No jaundice. The patient is alert and cooperative.   MOUTH: No thrush.   LYMPH: No palpable lymph nodes in axilla, submandibular, or axilla.   LUNGS: Clear. No wheezing, rales, or rhonchi.   HEART: Regular. No gallop.   ABDOMEN: Nontender. No palpable mass or organomegaly.   EXTREMITIES: No edema. The patient is thin.  NEUROLOGIC: Alert and cooperative. Cranial nerves grossly nonfocal. Neuro examination grossly nonfocal.   LABORATORY DATA: Hemoglobin is 8.   IMPRESSION: This is a patient who chronically has intermittent blood loss and has been getting ferriheme to successfully support the hemoglobin, has hemoglobin now back down to 9.7, but is more symptomatic than on prior visits, and also has had increased dark stools more recently all compatible with an exacerbation of chronic intermittent gastrointestinal blood loss. It is appropriate that she have packed red blood cell transfusion with slow but active blood loss, hemoglobin less than 10 and symptomatic, and I would not rely on ferriheme at this point. Also, as reviewed extensively with [name] today, she absolutely does not want any other evaluations, surgical or GI, and would not agree to any other interventions even though there is a possibility of some new or treatable pathology superimposed on her old pathology.   PLAN: The patient will be brought into the hospital. We will access her Port-A-Cath here which we can use for blood. We will get a ferritin and a metabolic  panel. We will put her on telemetry. We will get 1 unit of blood transfused and will repeat the hemoglobin in the morning and we will then plan to give intravenous ferriheme dose and then we will plan to discharge with close additional follow-up. I discussed blood transfusions, the  risks and benefits, and the patient is consented and signed that consent. ____________________________ Knute Neuobert G. Lorre NickGittin, MD rgg:slb D: 06/21/2012 14:29:04 ET T: 06/21/2012 15:24:47 ET JOB#: [id number]  cc: Knute Neuobert G. Lorre NickGittin, MD, <Dictator> Marin RobertsOBERT G GITTIN MD ELECTRONICALLY SIGNED 06/27/2012 14:09

## 2015-01-05 NOTE — Consult Note (Signed)
Brief Consult Note: Diagnosis: Palp abn Ekg arrthymia.   Patient was seen by consultant.   Consult note dictated.   Orders entered.   Discussed with Attending MD.   Comments: IMP Arrthymia Tachycardia Palp Abn Ekg Anemia . PLAN Agree with EKG Agree with transfusion Echo as outpt Increase activity No change cardiac meds Ok to d/c home if OW clinically statble.  Electronic Signatures: Dorothyann Pengallwood, Laurella Tull D (MD)  (Signed 05-Oct-13 10:46)  Authored: Brief Consult Note   Last Updated: 05-Oct-13 10:46 by Alwyn Peaallwood, Iniya Matzek D (MD)

## 2015-01-10 NOTE — Consult Note (Signed)
PATIENT NAME:  Kayla [name], Kayla [name] MR#:  [id number] DATE OF BIRTH:  06-13-1925  DATE OF CONSULTATION:  01/16/2012  REFERRING PHYSICIAN:  Dr. Sharma [name] CONSULTING PHYSICIAN:  Kayla SitesBert J. Klein III, MD  REASON FOR CONSULTATION: Cough, possible pneumonia.   HISTORY OF PRESENT ILLNESS: 81 year old female with history of diabetes, history of breast cancer, hypertension, sleep apnea, has had prior radiation therapy. She is followed closely by Dr. Lorre [name] for history of iron deficiency anemia and has [name] persistent leukocytosis. She has had [name] cough for approximately one month, did not try anything for it, did not seek help for it. She decided to go to an acute care clinic, where they performed [name] chest x-ray, which was abnormal, and she was transferred to the Emergency Room. There she was noted to have saturations of 93% to 94% on room air, which is reported to drop into the high 80s with movement. Her cough has been nonproductive. She has had no fevers, chills, wheezing. She has some shortness of breath at baseline, and she does not feel that this has changed at all over the last one month. She has had prior films performed, which note history of increased interstitial markings, consistent with fibrosis, making her chest x-ray difficult to evaluate, with chest x-ray here appearing to reveal bilateral pulmonary fibrosis, unclear if there was significant change. On examination when I saw her in the Emergency Room, her saturation was 95% on room air, she was resting in bed quietly, smiling, talking in complete sentences, and stating that she felt generally well.   PAST MEDICAL HISTORY:  1. Hyperlipidemia.  2. Chronic iron deficiency anemia.  3. History of chronic leukocytosis with prior evaluation through hematology.  4. Diabetes mellitus.  5. Sleep apnea.  6. History of breast cancer with prior lumpectomy and radiation therapy.  7. Osteoporosis.  8. Right hip replacement.  9. Status post hysterectomy.  10. Status post  appendectomy.  11. History of left humerus fracture.  12. History of right rotator cuff tear.   ALLERGIES: Aspirin, sulfa, epinephrine.   MEDICATIONS:  1. Metformin 1000 mg p.o. b.i.d.  2. Refresh Tears one drop to both eyes every morning.  3. Simvastatin 40 mg p.o. at bedtime.  4. Timolol 0.25% one drop to both eyes daily.  5. Lumigan 0.03% two drops to both eyes twice [name] day.  6. Combigan one drop both eyes b.i.d.  7. Vitamin D 1000 units p.o. b.i.d.  8. Multivitamin 1 p.o. daily.  9. Calcium 500 mg p.o. daily.  10. Vitamin B12 500 mg p.o. b.i.d.  11. Hydrochlorothiazide 12.5 mg p.o. daily.   SOCIAL HISTORY: No tobacco. Rare alcohol.   FAMILY HISTORY: No significant disease.   REVIEW OF SYSTEMS: Please see history of present illness. Denies fevers, chills, night sweats. No chest pain. No new vision changes. Chronic left eye irritation, unchanged. No bowel or bladder issues. No edema. No rash. Remainder of complete review of systems is negative.   PHYSICAL EXAMINATION: VITAL SIGNS: Temperature 97.8, pulse 90, respirations 12, blood pressure 139/69, saturation 95% room air, weight 57 kg.   GENERAL: Pleasant female, no distress.   HEENT: Pupils are round with left sclera injected. Erythema around left upper lid, chronic. TMs clear. Nasal mucosa reddened. Oropharynx is slightly dry without lesions.   NECK: Supple. Trachea midline. No thyromegaly.   CARDIOVASCULAR: Regular rate and rhythm without gallops, rubs. Pulses are 2+ in the carotids and radials.   LUNGS: Fine crackles are heard through the mid sections of both  lung fields with good air flow into the bases. No wheeze, retractions.   ABDOMEN: Soft, nontender, nondistended. Positive bowel sounds. No guard, rebound.   SKIN: No significant rashes or nodules.   LYMPH NODES: No cervical or supraclavicular nodes.   MUSCULOSKELETAL: No clubbing or cyanosis. No edema.   NEUROLOGIC: Cranial nerves intact. Motor strength appears  to be symmetrical.   LABORATORY, DIAGNOSTIC, AND RADIOLOGICAL DATA: Chest x-ray reveals increased interstitial markings bilaterally. EKG reveals sinus rhythm, first degree AV block. Troponin negative. BNP 175. BUN 12, creatinine 0.7, sodium 130, but with glucose 358, with patient not having taken her medicines. Liver unremarkable except an albumin of 3. White count 12.3, hemoglobin 10, platelets 226. Review of previous counts from May 2012 show white count 11.7 with hemoglobin 11.6.   IMPRESSION AND PLAN: Cough. Discussed with patient that we cannot exclude pneumonia secondary to abnormal chest x-ray, however, clinically she has no other signs or symptoms of this. Her BNP normal essentially would rule out pulmonary edema enough to be significant to show up on chest x-ray bilaterally. Her lack of dyspnea suggests that she likely has chronic pulmonary fibrosis, suggests she might actually have some chronic hypoxia as well. After discussing with patient and family members, she does not wish to be admitted, although this was offered to the patient and she is aware of risks and benefits of this. Will place her on Levaquin, place on Flonase for any allergic rhinitis or sinusitis which may be contributing to her cough, and give her some Tessalon Perles to use as needed. She absolutely needs to follow up with her primary care physician, this was stressed to the patient. Voice mail was left for him, and his office was given to the patient. Her daughter will stay with the patient tonight and make sure that contact is made that the patient can follow up regularly. Her medications need to be followed up as well and this can be taken care of when she returns to see her primary care physician. If she worsens she knows to return to the Emergency Room, by EMS if necessary. This case was also discussed briefly with the patient's hematologist as well.   ____________________________ Kayla Ferrier, MD bjk:cms D: 01/16/2012  18:07:53 ET T: 01/17/2012 05:29:31 ET  JOB#: [id number] cc: Kayla Ferrier, MD, <Dictator> Kayla Nones MD ELECTRONICALLY SIGNED 01/19/2012 12:28
